# Patient Record
Sex: Female | Born: 1990 | Race: White | Hispanic: No | Marital: Single | Smoking: Never smoker
Health system: Southern US, Community
[De-identification: ages and names within clinical notes are randomized; demographics above are authoritative.]

---
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
## 2010-06-05 IMAGING — RF DG BARIUM SWALLOW
1 series · 15 of 24 positions shown · non-contrast
Comparison: none

REASON FOR EXAM: esophageal reflux chest pain
COMMENTS:

[Series 1: run · 6 acquisitions, 15 frames shown]
[im 1/6]
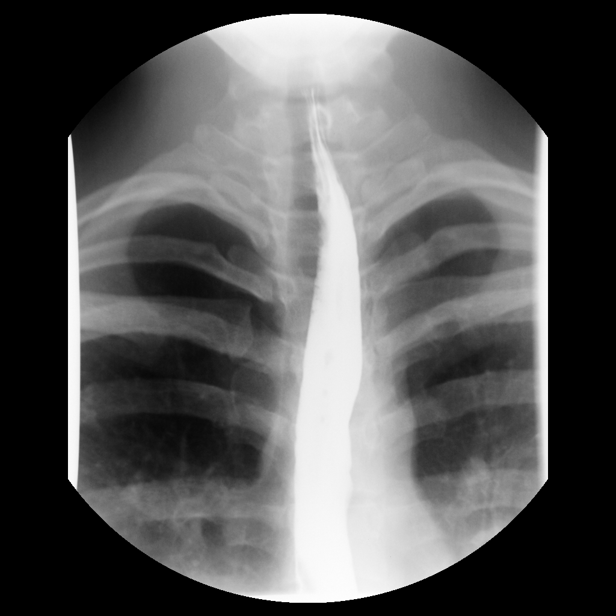
[im 1/6]
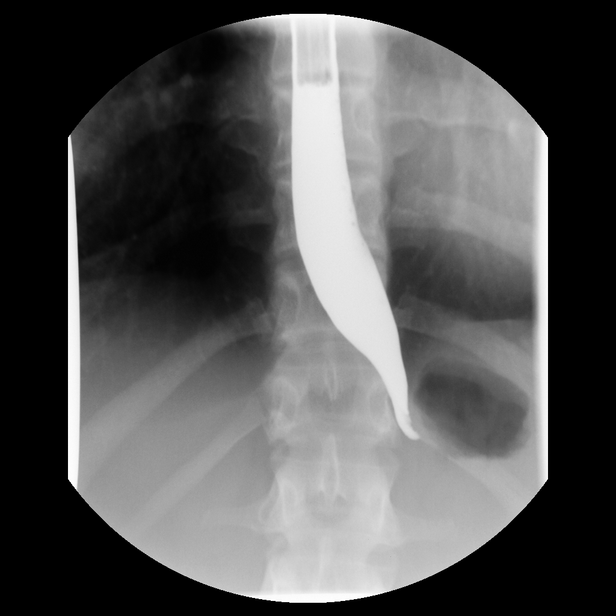
[im 2/6]
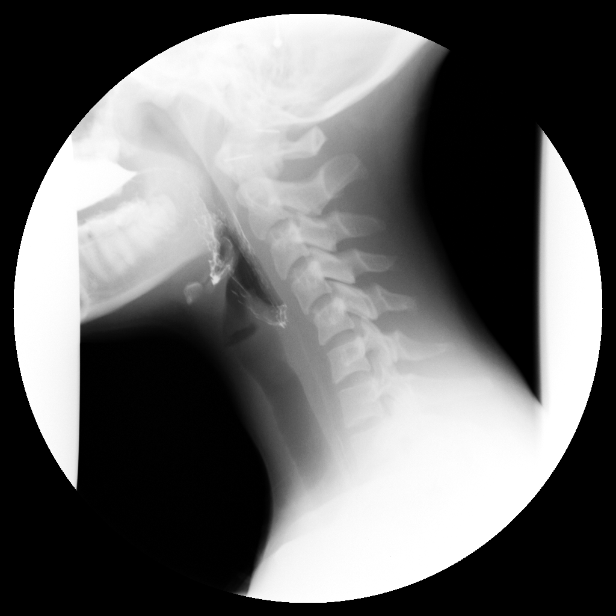
[im 2/6]
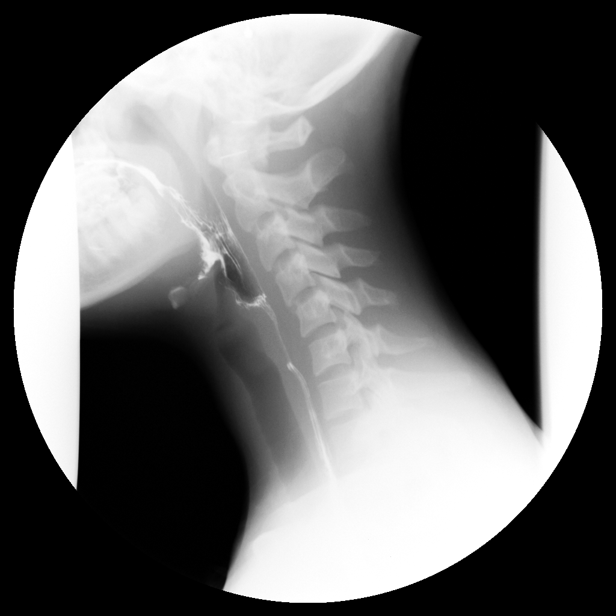
[im 2/6]
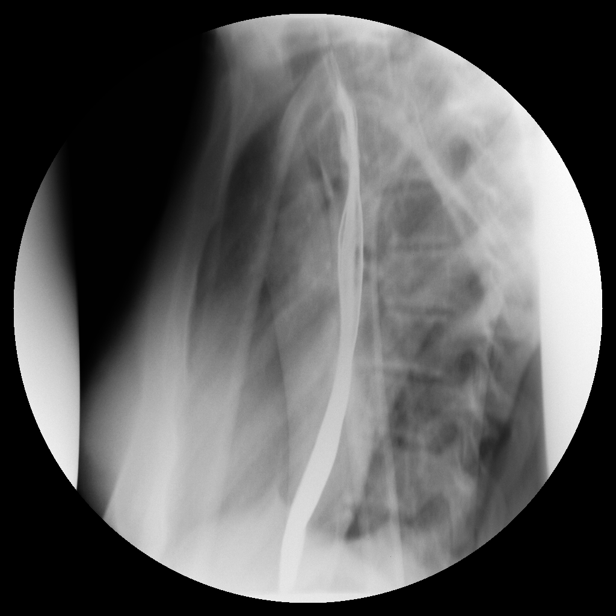
[im 3/6]
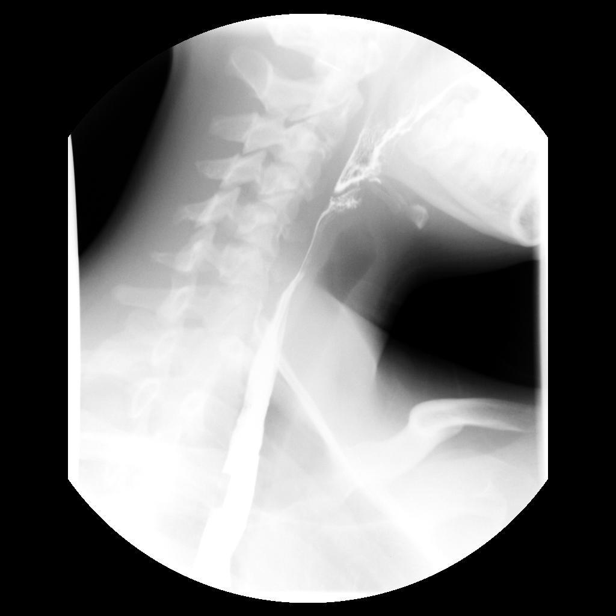
[im 3/6]
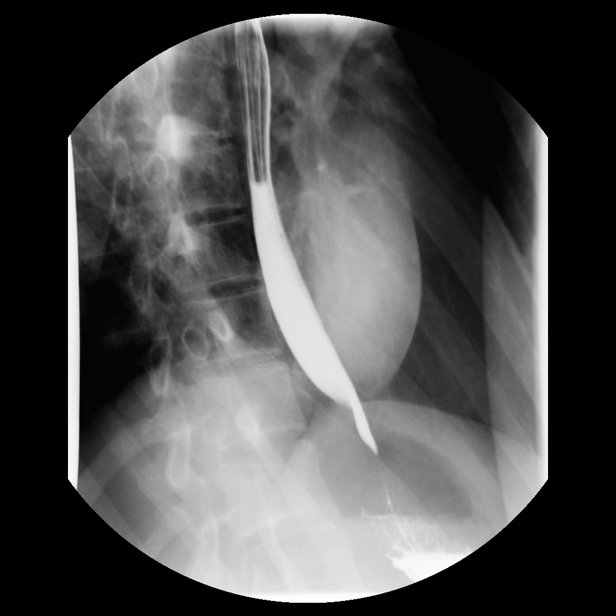
[im 4/6]
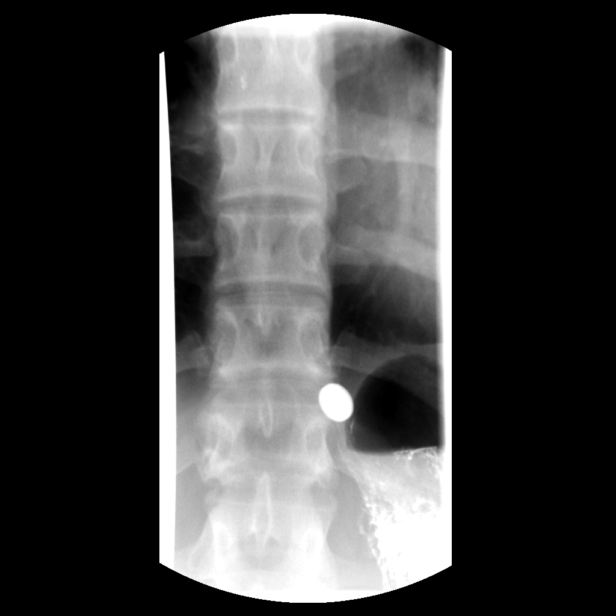
[im 4/6]
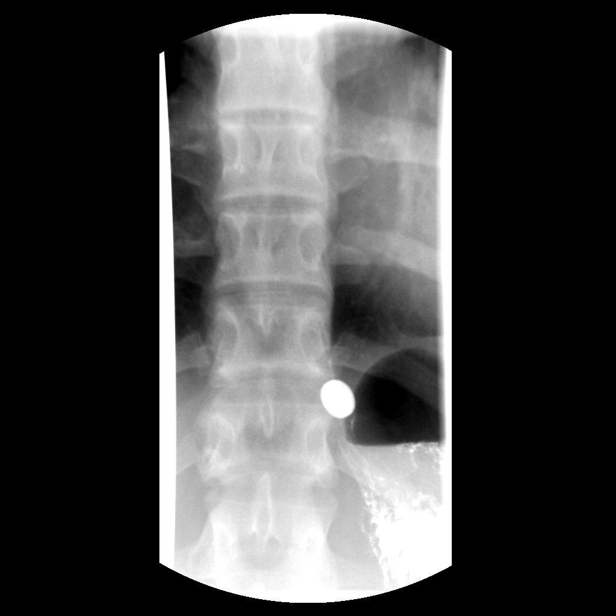
[im 4/6]
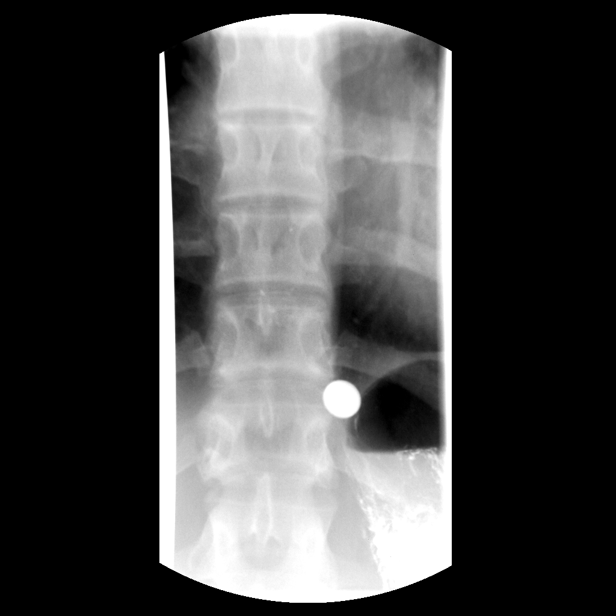
[im 5/6]
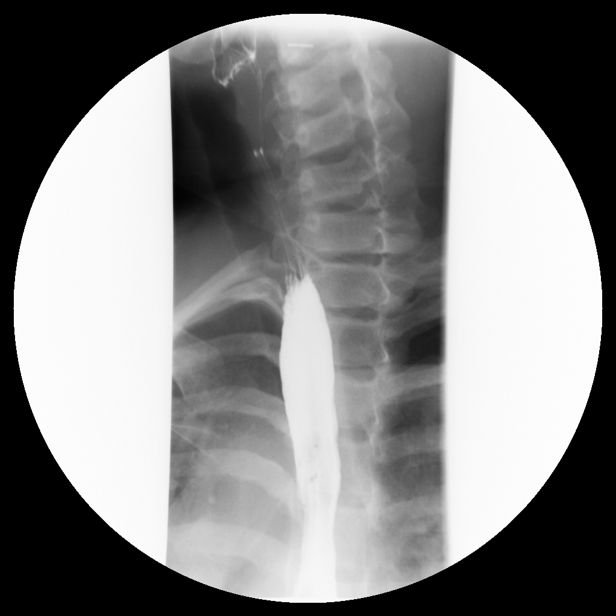
[im 5/6]
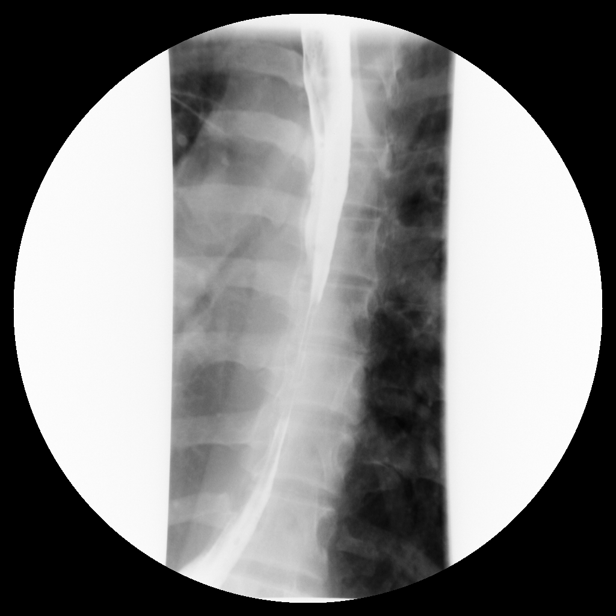
[im 6/6]
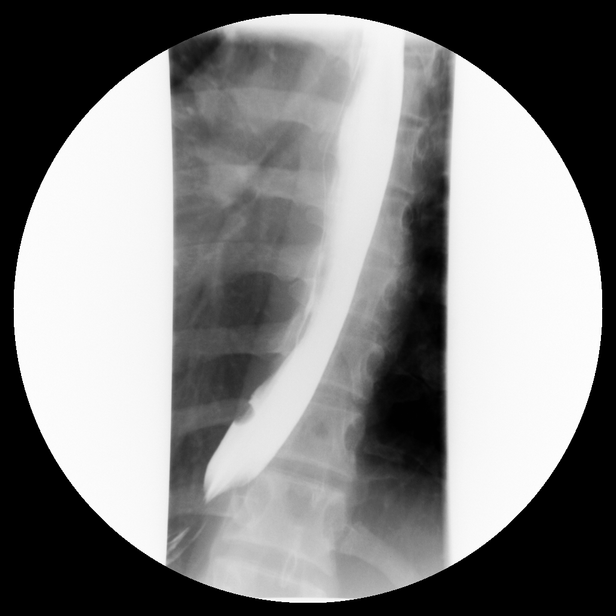
[im 6/6]
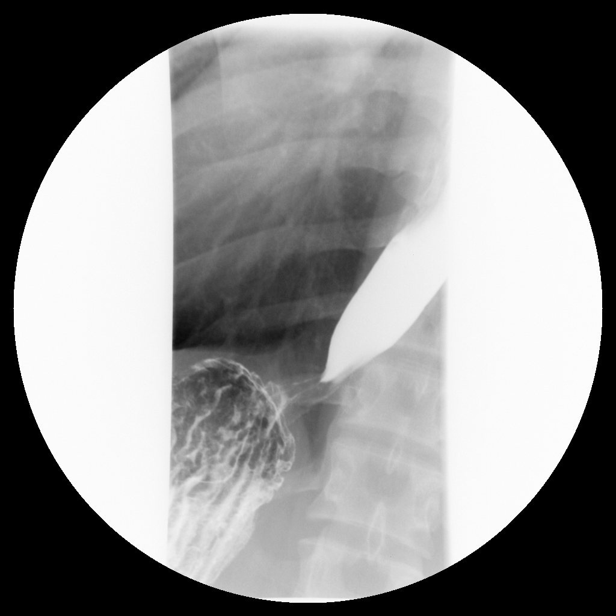
[im 6/6]
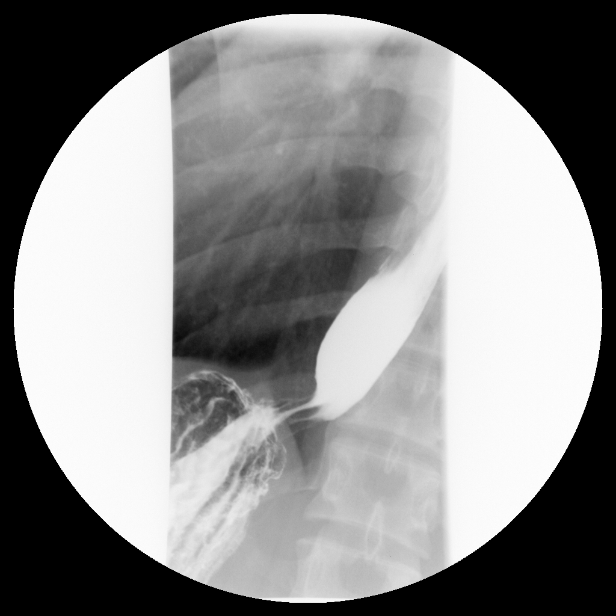

[15 of 24 positions shown; findings below may reference images not displayed]

PROCEDURE:     FL  - FL BARIUM SWALLOW  - July 13, 2009  [DATE]

RESULT:     A routine barium swallow examination shows the patient easily
ingests the liquid barium. There is no evidence of aspiration or penetration
of barium into the upper airway. The esophageal mucosal pattern is normal. A
12.5 mm barium-impregnated tablet passes easily through the esophagus into
the stomach. There is no gastroesophageal reflux or hiatal hernia.
IMPRESSION: Normal appearing barium swallow.

## 2013-08-20 HISTORY — PX: AUGMENTATION MAMMAPLASTY: SUR837

## 2013-10-13 ENCOUNTER — Ambulatory Visit: Payer: Self-pay | Admitting: Internal Medicine

## 2013-10-13 LAB — RAPID STREP-A WITH REFLX: MICRO TEXT REPORT: NEGATIVE

## 2013-10-16 LAB — BETA STREP CULTURE(ARMC)

## 2015-09-22 ENCOUNTER — Encounter: Payer: Self-pay | Admitting: Emergency Medicine

## 2015-09-22 ENCOUNTER — Ambulatory Visit
Admission: EM | Admit: 2015-09-22 | Discharge: 2015-09-22 | Disposition: A | Discharge status: Home / Self Care | Payer: BC Managed Care – PPO | Attending: Family Medicine | Admitting: Family Medicine

## 2015-09-22 DIAGNOSIS — N390 Urinary tract infection, site not specified: Secondary | ICD-10-CM

## 2015-09-22 LAB — URINALYSIS COMPLETE WITH MICROSCOPIC (ARMC ONLY)
Bilirubin Urine: NEGATIVE
Glucose, UA: NEGATIVE mg/dL
Ketones, ur: NEGATIVE mg/dL
NITRITE: NEGATIVE
PH: 5.5 (ref 5.0–8.0)
Protein, ur: >300 mg/dL — AB
Specific Gravity, Urine: >1.03 — ABNORMAL HIGH (ref 1.005–1.030)

## 2015-09-22 MED ORDER — CIPROFLOXACIN HCL 500 MG PO TABS: 500.0000 mg | ORAL_TABLET | Freq: Two times a day (BID) | ORAL | Status: DC

## 2015-09-22 MED ORDER — PHENAZOPYRIDINE HCL 200 MG PO TABS: 200.0000 mg | ORAL_TABLET | Freq: Three times a day (TID) | ORAL | Status: DC | PRN

## 2015-09-22 NOTE — ED Notes (Signed)
Pt reports dysuria that started yesterday denies known fever or vaginal discharged reports was seen at [facility] and had yearly exam 4 days ago

## 2015-09-22 NOTE — Discharge Instructions (Signed)

## 2015-09-22 NOTE — ED Provider Notes (Signed)
CSN: [id number]     Arrival date & time 09/22/15  4098 History   First MD Initiated Contact with Patient 09/22/15 [id number]    Nurses notes were reviewed. Chief Complaint  Patient presents with  . Dysuria   patient reports pain having sexual relations Wednesday and soon afterwards having dysuria and burning in urination. She doesn't report any frequency. Denies having vaginal discharge. States she did have some dyspareunia and states that she has something similar to this one time she had BV but this time there is no discharge. She doesn't usually get UTIs. (Consider location/radiation/quality/duration/timing/severity/associated sxs/prior Treatment) Patient is a 25 y.o. female presenting with dysuria. The history is provided by the patient. No language interpreter was used.  Dysuria Pain quality:  Sharp Pain severity:  Moderate Onset quality:  Sudden Duration:  1 day Progression:  Worsening Chronicity:  New Relieved by:  Nothing Worsened by:  Nothing tried Urinary symptoms: no discolored urine, no foul-smelling urine, no frequent urination, no hematuria and no bladder incontinence   Associated symptoms: no abdominal pain, no fever, no flank pain, no genital lesions and no vaginal discharge     No past medical history on file. No past surgical history on file. No family history on file. Social History  Substance Use Topics  . Smoking status: Never Smoker   . Smokeless tobacco: None  . Alcohol Use: Yes     Comment: occassional    OB History    No data available     Review of Systems  Constitutional: Negative for fever.  Gastrointestinal: Negative for abdominal pain.  Genitourinary: Positive for dysuria. Negative for flank pain and vaginal discharge.  All other systems reviewed and are negative.   Allergies  Review of patient's allergies indicates no known allergies.  Home Medications   Prior to Admission medications   Medication Sig Start Date End Date Taking? Authorizing  Provider  ciprofloxacin (CIPRO) 500 MG tablet Take 1 tablet (500 mg total) by mouth 2 (two) times daily. 09/22/15   Hassan Rowan, MD  phenazopyridine (PYRIDIUM) 200 MG tablet Take 1 tablet (200 mg total) by mouth 3 (three) times daily as needed for pain. 09/22/15   Hassan Rowan, MD   Meds Ordered and Administered this Visit  Medications - No data to display  BP 114/73 mmHg  Pulse 85  Temp(Src) 97 F (36.1 C) (Tympanic)  Resp 16  Ht  (1.753 m)  Wt 135 lb (61.236 kg)  BMI 19.93 kg/m2  SpO2 100%  LMP 09/15/2015 (Exact Date) No data found.   Physical Exam  Constitutional: She appears well-developed and well-nourished.  HENT:  Head: Normocephalic and atraumatic.  Eyes: Conjunctivae are normal. Pupils are equal, round, and reactive to light.  Abdominal: Soft. Bowel sounds are normal. She exhibits no distension. There is no tenderness. There is no CVA tenderness.  Musculoskeletal: Normal range of motion.  Neurological: She is alert.  Skin: Skin is dry. No erythema.  Psychiatric: Her mood appears anxious. She does not exhibit a depressed mood.  Vitals reviewed.   ED Course  Procedures (including critical care time)  Labs Review Labs Reviewed  URINALYSIS COMPLETEWITH MICROSCOPIC ([facility] ONLY) - Abnormal; Notable for the following:    Specific Gravity, Urine >1.030 (*)    Hgb urine dipstick TRACE (*)    Protein, ur >300 (*)    Leukocytes, UA 1+ (*)    Bacteria, UA MANY (*)    Squamous Epithelial / LPF 0-5 (*)    All other components  within normal limits  URINE CULTURE    Imaging Review No results found.   Visual Acuity Review  Right Eye Distance:   Left Eye Distance:   Bilateral Distance:    Right Eye Near:   Left Eye Near:    Bilateral Near:      Results for orders placed or performed during the hospital encounter of 09/22/15  Urine culture  Result Value Ref Range   Specimen Description URINE, CLEAN CATCH    Special Requests Normal    Culture >=100,000  COLONIES/mL STAPHYLOCOCCUS AUREUS    Report Status 09/24/2015 FINAL    Organism ID, Bacteria STAPHYLOCOCCUS AUREUS       Susceptibility   Staphylococcus aureus - MIC*    CIPROFLOXACIN <=0.5 SENSITIVE Sensitive     GENTAMICIN <=0.5 SENSITIVE Sensitive     NITROFURANTOIN <=16 SENSITIVE Sensitive     OXACILLIN <=0.25 SENSITIVE Sensitive     TETRACYCLINE <=1 SENSITIVE Sensitive     VANCOMYCIN 1 SENSITIVE Sensitive     TRIMETH/SULFA <=10 SENSITIVE Sensitive     RIFAMPIN <=0.5 SENSITIVE Sensitive     Inducible Clindamycin Value in next row Resistant      POSITIVEINDUCIBLE CLINDAMYCIN RESISTANCE - A positive ICR test is indicative of inducible resistance to macrolides, lincosamides, and type B streptogramin.  This isolate is presumed to be resistant to Clindamycin, however, Clindamycin may still be effective in some patients.     * >=100,000 COLONIES/mL STAPHYLOCOCCUS AUREUS  Urinalysis complete, with microscopic  Result Value Ref Range   Color, Urine YELLOW YELLOW   APPearance CLEAR CLEAR   Glucose, UA NEGATIVE NEGATIVE mg/dL   Bilirubin Urine NEGATIVE NEGATIVE   Ketones, ur NEGATIVE NEGATIVE mg/dL   Specific Gravity, Urine >1.030 (H) 1.005 - 1.030   Hgb urine dipstick TRACE (A) NEGATIVE   pH 5.5 5.0 - 8.0   Protein, ur >300 (A) NEGATIVE mg/dL   Nitrite NEGATIVE NEGATIVE   Leukocytes, UA 1+ (A) NEGATIVE   RBC / HPF 0-5 0 - 5 RBC/hpf   WBC, UA TOO NUMEROUS TO COUNT 0 - 5 WBC/hpf   Bacteria, UA MANY (A) NONE SEEN   Squamous Epithelial / LPF 0-5 (A) NONE SEEN     MDM   1. UTI (lower urinary tract infection)     Patient with an abnormal urine will obtain urine culture sensitivity placed on Cipro 500 mg 1 tablet twice a day for 7 days. If symptoms continue to come back for seen and be evaluated did discuss best hygiene practices of going to the bathroom before she has sexual relations. Worked OB office for today and tomorrow if needed. And also prescription for Pyridium 200 mg 3 times a  day for 5 days   Note: This dictation was prepared with Dragon dictation along with smaller phrase technology. Any transcriptional errors that result from this process are unintentional.   Hassan Rowan, MD 09/27/15 1032

## 2015-09-24 LAB — URINE CULTURE: SPECIAL REQUESTS: NORMAL

## 2015-11-15 DIAGNOSIS — A6000 Herpesviral infection of urogenital system, unspecified: Secondary | ICD-10-CM | POA: Insufficient documentation

## 2015-11-15 LAB — HM HIV SCREENING LAB: HM HIV Screening: NEGATIVE

## 2016-11-07 ENCOUNTER — Ambulatory Visit: Payer: Self-pay | Admitting: Advanced Practice Midwife

## 2016-11-28 ENCOUNTER — Other Ambulatory Visit: Payer: Self-pay

## 2016-11-28 DIAGNOSIS — Z30011 Encounter for initial prescription of contraceptive pills: Secondary | ICD-10-CM

## 2016-11-28 MED ORDER — VIENVA 0.1-20 MG-MCG PO TABS: 1.0000 | ORAL_TABLET | Freq: Every day | ORAL | 1 refills | Status: DC

## 2016-12-04 ENCOUNTER — Other Ambulatory Visit: Payer: Self-pay | Admitting: Advanced Practice Midwife

## 2016-12-04 DIAGNOSIS — Z30011 Encounter for initial prescription of contraceptive pills: Secondary | ICD-10-CM

## 2016-12-17 ENCOUNTER — Encounter: Payer: Self-pay | Admitting: Obstetrics & Gynecology

## 2016-12-17 ENCOUNTER — Ambulatory Visit (INDEPENDENT_AMBULATORY_CARE_PROVIDER_SITE_OTHER): Payer: BLUE CROSS/BLUE SHIELD | Admitting: Obstetrics & Gynecology

## 2016-12-17 VITALS — BP 126/72 | HR 97 | Ht 68.0 in | Wt 148.0 lb

## 2016-12-17 DIAGNOSIS — Z0000 Encounter for general adult medical examination without abnormal findings: Secondary | ICD-10-CM

## 2016-12-17 DIAGNOSIS — Z124 Encounter for screening for malignant neoplasm of cervix: Secondary | ICD-10-CM

## 2016-12-17 DIAGNOSIS — T384X5D Adverse effect of oral contraceptives, subsequent encounter: Secondary | ICD-10-CM

## 2016-12-17 MED ORDER — VIENVA 0.1-20 MG-MCG PO TABS: 1.0000 | ORAL_TABLET | Freq: Every day | ORAL | 12 refills | Status: DC

## 2016-12-17 NOTE — Progress Notes (Signed)
   HPI:      [name] is a 26 y.o. G0P0000 who LMP was Patient's last menstrual period was 12/05/2016., she presents today for her annual examination. The patient has no complaints today. The patient is sexually active. Her last pap: was normal. The patient does perform self breast exams.  There is no notable family history of breast or ovarian cancer in her family.  The patient has regular exercise: yes.  The patient denies current symptoms of depression.    GYN History: Contraception: OCP (estrogen/progesterone)  PMHx: History reviewed. No pertinent past medical history. Past Surgical History:  Procedure Laterality Date  . AUGMENTATION MAMMAPLASTY  2015   History reviewed. No pertinent family history. Social History  Substance Use Topics  . Smoking status: Never Smoker  . Smokeless tobacco: Never Used  . Alcohol use Yes     Comment: occassional     Current Outpatient Prescriptions:  .  ALPRAZolam (XANAX) 0.25 MG tablet, TAKE 1 TABLET BY MOUTH DAILY FOR SOCIAL ANXIETY/PHOBIA, Disp: , Rfl: 5 .  SYNTHROID 100 MCG tablet, Take 100 mcg by mouth daily., Disp: , Rfl: 5 .  VIENVA 0.1-20 MG-MCG tablet, Take 1 tablet by mouth daily., Disp: 1 Package, Rfl: 12 Allergies: Patient has no known allergies.  ROS  Objective: BP 126/72 (BP Location: Left Arm, Patient Position: Sitting, Cuff Size: Normal)   Pulse 97   Ht  (1.727 m)   Wt 148 lb (67.1 kg)   LMP 12/05/2016   BMI 22.50 kg/m   Filed Weights   12/17/16 1548  Weight: 148 lb (67.1 kg)   Body mass index is 22.5 kg/m. OBGyn Exam  Assessment:  ANNUAL EXAM 1. Annual physical exam   2. Screening for cervical cancer   3. Oral contraceptive causing adverse effect in therapeutic use, subsequent encounter      Screening Plan:            1.  Cervical Screening-  Pap smear done today  2. Breast screening- Exam annually and mammogram>40 planned   3. Colonoscopy every 10 years, Hemoccult testing - after age 21  4.  Labs Declines  5. Counseling for contraception: oral progesterone-only contraceptive  Other:  1. Annual physical exam  2. Screening for cervical cancer - IGP,CtNgTv,rfx Aptima HPV ASCU  3. Oral contraceptive causing adverse effect in therapeutic use, subsequent encounter - VIENVA 0.1-20 MG-MCG tablet; Take 1 tablet by mouth daily.  Dispense: 1 Package; Refill: 12  4. Past off and on HSV testing done at [facility].  No sx's of recent note.  Monitor.  Offered repeat testing, declined.    F/U  Return in about 1 year (around 12/17/2017).  [name], MD, [facility], [facility] 12/17/2016  4:30 PM

## 2016-12-20 LAB — IGP,CTNGTV,RFX APTIMA HPV ASCU
CHLAMYDIA, NUC. ACID AMP: NEGATIVE
Gonococcus, Nuc. Acid Amp: NEGATIVE
PAP SMEAR COMMENT: 0
Trich vag by NAA: NEGATIVE

## 2017-07-04 ENCOUNTER — Telehealth: Payer: Self-pay | Admitting: Obstetrics & Gynecology

## 2017-07-04 NOTE — Telephone Encounter (Signed)
Pt advised to call pharmacy for pick up
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
## 2017-07-04 NOTE — Telephone Encounter (Signed)
Mail box full pt has 12 refills on her BC just needs to call pharmacy and pick up

## 2018-01-29 ENCOUNTER — Other Ambulatory Visit: Payer: Self-pay | Admitting: Obstetrics & Gynecology

## 2018-01-29 DIAGNOSIS — T384X5D Adverse effect of oral contraceptives, subsequent encounter: Secondary | ICD-10-CM

## 2018-01-30 NOTE — Telephone Encounter (Signed)
Called and spoke with patient to schedule annual exam. Patient wishes to call back to schedule appt

## 2018-01-30 NOTE — Telephone Encounter (Signed)
Please schedule Annual Appt.  Will refill Rx for birth control while waiting for appt.

## 2018-02-27 ENCOUNTER — Other Ambulatory Visit (HOSPITAL_COMMUNITY)
Admission: RE | Admit: 2018-02-27 | Discharge: 2018-02-27 | Disposition: A | Discharge status: Home / Self Care | Payer: BLUE CROSS/BLUE SHIELD | Source: Ambulatory Visit | Attending: Obstetrics & Gynecology | Admitting: Obstetrics & Gynecology

## 2018-02-27 ENCOUNTER — Encounter: Payer: Self-pay | Admitting: Obstetrics & Gynecology

## 2018-02-27 ENCOUNTER — Ambulatory Visit (INDEPENDENT_AMBULATORY_CARE_PROVIDER_SITE_OTHER): Payer: BLUE CROSS/BLUE SHIELD | Admitting: Obstetrics & Gynecology

## 2018-02-27 VITALS — BP 100/60 | Ht 69.0 in | Wt 156.0 lb

## 2018-02-27 DIAGNOSIS — Z0000 Encounter for general adult medical examination without abnormal findings: Secondary | ICD-10-CM

## 2018-02-27 DIAGNOSIS — E039 Hypothyroidism, unspecified: Secondary | ICD-10-CM

## 2018-02-27 DIAGNOSIS — Z124 Encounter for screening for malignant neoplasm of cervix: Secondary | ICD-10-CM | POA: Diagnosis not present

## 2018-02-27 DIAGNOSIS — Z308 Encounter for other contraceptive management: Secondary | ICD-10-CM | POA: Insufficient documentation

## 2018-02-27 NOTE — Progress Notes (Signed)
HPI:      Sara Conway is a 27 y.o. G0P0000 who LMP was Patient's last menstrual period was 02/24/2018., she presents today for her annual examination. The patient has no complaints today. Occas Post Coital Bleeding.  Stopped OCPs 3 weeks ago, plans to use condoms only until after wedding then try for pregnancy.  Has noticed worsening acne since stopping.  The patient is sexually active. Her last pap: was normal. The patient does perform self breast exams.  There is no notable family history of breast or ovarian cancer in her family.  The patient has regular exercise: yes.  The patient denies current symptoms of depression.    GYN History: Contraception: condoms  PMHx: History reviewed. No pertinent past medical history. Past Surgical History:  Procedure Laterality Date  . AUGMENTATION MAMMAPLASTY  2015   History reviewed. No pertinent family history. Social History   Tobacco Use  . Smoking status: Never Smoker  . Smokeless tobacco: Never Used  Substance Use Topics  . Alcohol use: Yes    Comment: occassional   . Drug use: Yes    Types: Marijuana    Current Outpatient Medications:  .  ALPRAZolam (XANAX) 0.25 MG tablet, TAKE 1 TABLET BY MOUTH DAILY FOR SOCIAL ANXIETY/PHOBIA, Disp: , Rfl: 5 .  SYNTHROID 100 MCG tablet, Take 100 mcg by mouth daily., Disp: , Rfl: 5 .  LARISSIA 0.1-20 MG-MCG tablet, TAKE 1 TABLET BY MOUTH EVERY DAY (Patient not taking: Reported on 02/27/2018), Disp: 84 tablet, Rfl: 0 Allergies: Patient has no known allergies.  Review of Systems  Constitutional: Negative for chills, fever and malaise/fatigue.  HENT: Negative for congestion, sinus pain and sore throat.   Eyes: Negative for blurred vision and pain.  Respiratory: Negative for cough and wheezing.   Cardiovascular: Negative for chest pain and leg swelling.  Gastrointestinal: Negative for abdominal pain, constipation, diarrhea, heartburn, nausea and vomiting.  Genitourinary: Negative for dysuria,  frequency, hematuria and urgency.  Musculoskeletal: Negative for back pain, joint pain, myalgias and neck pain.  Skin: Negative for itching and rash.  Neurological: Negative for dizziness, tremors and weakness.  Endo/Heme/Allergies: Does not bruise/bleed easily.  Psychiatric/Behavioral: Negative for depression. The patient is not nervous/anxious and does not have insomnia.    Objective: BP 100/60   Ht 5\' 9"  (1.753 m)   Wt 156 lb (70.8 kg)   LMP 02/24/2018   BMI 23.04 kg/m   Filed Weights   02/27/18 0813  Weight: 156 lb (70.8 kg)   Body mass index is 23.04 kg/m. Physical Exam  Constitutional: She is oriented to person, place, and time. She appears well-developed and well-nourished. No distress.  Genitourinary: Rectum normal, vagina normal and uterus normal. Pelvic exam was performed with patient supine. There is no rash or lesion on the right labia. There is no rash or lesion on the left labia. Vagina exhibits no lesion. No bleeding in the vagina. Right adnexum does not display mass and does not display tenderness. Left adnexum does not display mass and does not display tenderness. Cervix does not exhibit motion tenderness, lesion, friability or polyp.   Uterus is mobile and midaxial. Uterus is not enlarged or exhibiting a mass.  HENT:  Head: Normocephalic and atraumatic. Head is without laceration.  Right Ear: Hearing normal.  Left Ear: Hearing normal.  Nose: No epistaxis.  No foreign bodies.  Mouth/Throat: Uvula is midline, oropharynx is clear and moist and mucous membranes are normal.  Eyes: Pupils are equal, round, and reactive  to light.  Neck: Normal range of motion. Neck supple. No thyromegaly present.  Cardiovascular: Normal rate and regular rhythm. Exam reveals no gallop and no friction rub.  No murmur heard. Pulmonary/Chest: Effort normal and breath sounds normal. No respiratory distress. She has no wheezes. Right breast exhibits no mass, no skin change and no tenderness.  Left breast exhibits no mass, no skin change and no tenderness.  Abdominal: Soft. Bowel sounds are normal. She exhibits no distension. There is no tenderness. There is no rebound.  Musculoskeletal: Normal range of motion.  Neurological: She is alert and oriented to person, place, and time. No cranial nerve deficit.  Skin: Skin is warm and dry.  Psychiatric: She has a normal mood and affect. Judgment normal.  Vitals reviewed.   Assessment:  ANNUAL EXAM 1. Annual physical exam   2. Encounter for other contraceptive management   3. Hypothyroidism, unspecified type   4. Screening for cervical cancer    Screening Plan:            1.  Cervical Screening-  Pap smear done today  2. Labs managed by PCP  TSH by endocrinologist last month normal  3. Counseling for contraception: condoms  Plans to try for pregnancy next year PNV discussed  4. Hypothyroidism, unspecified type Cont thyroid meds Pregnancy and thyroid discussed    F/U  Return in about 1 year (around 02/28/2019) for Annual.  Sara Major, MD, [facility], [facility] 02/27/2018  8:50 AM

## 2018-02-28 LAB — CYTOLOGY - PAP
Chlamydia: NEGATIVE
DIAGNOSIS: NEGATIVE
Neisseria Gonorrhea: NEGATIVE
TRICH (WINDOWPATH): NEGATIVE

## 2018-10-16 ENCOUNTER — Other Ambulatory Visit: Payer: Self-pay | Admitting: Obstetrics & Gynecology

## 2019-03-04 ENCOUNTER — Other Ambulatory Visit: Payer: Self-pay | Admitting: Family Medicine

## 2019-03-04 DIAGNOSIS — Z20822 Contact with and (suspected) exposure to covid-19: Secondary | ICD-10-CM

## 2019-03-05 ENCOUNTER — Other Ambulatory Visit: Payer: Self-pay | Admitting: Internal Medicine

## 2019-03-05 DIAGNOSIS — Z20822 Contact with and (suspected) exposure to covid-19: Secondary | ICD-10-CM

## 2019-03-10 ENCOUNTER — Telehealth: Payer: Self-pay

## 2019-03-10 ENCOUNTER — Other Ambulatory Visit: Payer: Self-pay | Admitting: Obstetrics & Gynecology

## 2019-03-10 ENCOUNTER — Ambulatory Visit: Payer: BC Managed Care – PPO | Admitting: Obstetrics & Gynecology

## 2019-03-10 LAB — NOVEL CORONAVIRUS, NAA: SARS-CoV-2, NAA: DETECTED — AB

## 2019-03-10 NOTE — Progress Notes (Signed)
Pt had Covid test as co-worker tested positive.  She went to drive thru facility; did not see MD.  Test returned positive.  I noted this today as prepared for her annual exam here.  Called and discussed results w her, she was not aware yet of the results.  Rescheduled her appt for >21 days from test per protocol.  She reports only sx's of diminished taste and smell.  Denies fever, cough, SOB.  Advised her to contact PCP for further instructions.  Advised her to quarantine at home and avoid exposure to others especially those at risk.  Advised her to seek medical care if worsening sx's develop.  Barnett Applebaum, MD, [facility], [facility] 03/10/2019  3:45 PM

## 2019-03-10 NOTE — Telephone Encounter (Signed)
Please call pt and discuss Corona results, pt has annual scheduled with [place] today

## 2019-03-30 ENCOUNTER — Other Ambulatory Visit (HOSPITAL_COMMUNITY)
Admission: RE | Admit: 2019-03-30 | Discharge: 2019-03-30 | Disposition: A | Discharge status: Home / Self Care | Payer: BC Managed Care – PPO | Source: Ambulatory Visit | Attending: Obstetrics & Gynecology | Admitting: Obstetrics & Gynecology

## 2019-03-30 ENCOUNTER — Other Ambulatory Visit: Payer: Self-pay

## 2019-03-30 ENCOUNTER — Ambulatory Visit (INDEPENDENT_AMBULATORY_CARE_PROVIDER_SITE_OTHER): Payer: BC Managed Care – PPO | Admitting: Obstetrics & Gynecology

## 2019-03-30 ENCOUNTER — Encounter: Payer: Self-pay | Admitting: Obstetrics & Gynecology

## 2019-03-30 VITALS — BP 120/80 | Ht 69.0 in | Wt 158.0 lb

## 2019-03-30 DIAGNOSIS — Z3169 Encounter for other general counseling and advice on procreation: Secondary | ICD-10-CM

## 2019-03-30 DIAGNOSIS — Z124 Encounter for screening for malignant neoplasm of cervix: Secondary | ICD-10-CM | POA: Diagnosis present

## 2019-03-30 DIAGNOSIS — N970 Female infertility associated with anovulation: Secondary | ICD-10-CM

## 2019-03-30 DIAGNOSIS — T384X5D Adverse effect of oral contraceptives, subsequent encounter: Secondary | ICD-10-CM

## 2019-03-30 DIAGNOSIS — Z308 Encounter for other contraceptive management: Secondary | ICD-10-CM

## 2019-03-30 DIAGNOSIS — Z01419 Encounter for gynecological examination (general) (routine) without abnormal findings: Secondary | ICD-10-CM

## 2019-03-30 MED ORDER — LARISSIA 0.1-20 MG-MCG PO TABS: 1.0000 | ORAL_TABLET | Freq: Every day | ORAL | 3 refills | Status: DC

## 2019-03-30 NOTE — Patient Instructions (Signed)
Female Infertility  Female infertility refers to a woman's inability to get pregnant (conceive) after a year of having sex regularly (or after 6 months in women over age 28) without using birth control. Infertility can also mean that a woman is not able to carry a pregnancy to full term. Both women and men can have fertility problems. What are the causes? This condition may be caused by:  Problems with reproductive organs. Infertility can result if a woman: ? Has an abnormally short cervix or a cervix that does not remain closed during a pregnancy. ? Has a blockage or scarring in the fallopian tubes. ? Has an abnormally shaped uterus. ? Has uterine fibroids. This is a benign mass of tissue or muscle (tumor) that can develop in the uterus. ? Is not ovulating in a regular way.  Certain medical conditions. These may include: ? Polycystic ovary syndrome (PCOS). This is a hormonal disorder that can cause small cysts to grow on the ovaries. This is the most common cause of infertility in women. ? Endometriosis. This is a condition in which the tissue that lines the uterus (endometrium) grows outside of its normal location. ? Cancer and cancer treatments, such as chemotherapy or radiation. ? Premature ovarian failure. This is when ovaries stop producing eggs and hormones before age 40. ? Sexually transmitted diseases, such as chlamydia or gonorrhea. ? Autoimmune disorders. These are disorders in which the body's defense system (immune system) attacks normal, healthy cells. Infertility can be linked to more than one cause. For some women, the cause of infertility is not known (unexplained infertility). What increases the risk?  Age. A woman's fertility declines with age, especially after her mid-30s.  Being underweight or overweight.  Drinking too much alcohol.  Using drugs such as anabolic steroids, cocaine, and marijuana.  Exercising excessively.  Being exposed to environmental toxins,  such as radiation, pesticides, and certain chemicals. What are the signs or symptoms? The main sign of infertility in women is the inability to get pregnant or carry a pregnancy to full term. How is this diagnosed? This condition may be diagnosed by:  Checking whether you are ovulating each month. The tests may include: ? Blood tests to check hormone levels. ? An ultrasound of the ovaries. ? Taking a small tissue that lines the uterus and checking it under a microscope (endometrial biopsy).  Doing additional tests. This is done if ovulation is normal. Tests may include: ? Hysterosalpingography. This X-ray test can show the shape of the uterus and whether the fallopian tubes are open. ? Laparoscopy. This test uses a lighted tube (laparoscope) to look for problems in the fallopian tubes and other organs. ? Transvaginal ultrasound. This imaging test is used to check for abnormalities in the uterus and ovaries. ? Hysteroscopy. This test uses a lighted tube to check for problems in the cervix and the uterus. To be diagnosed with infertility, both partners will have a physical exam. Both partners will also have an extensive medical and sexual history taken. Additional tests may be done. How is this treated? Treatment depends on the cause of infertility. Most cases of infertility in women are treated with medicine or surgery.  Women may take medicine to: ? Correct ovulation problems. ? Treat other health conditions.  Surgery may be done to: ? Repair damage to the ovaries, fallopian tubes, cervix, or uterus. ? Remove growths from the uterus. ? Remove scar tissue from the uterus, pelvis, or other organs. Assisted reproductive technology (ART) Assisted reproductive technology (  ART) refers to all treatments and procedures that combine eggs and sperm outside the body to try to help a couple conceive. ART is often combined with fertility drugs to stimulate ovulation. Sometimes ART is done using eggs  retrieved from another woman's body (donor eggs) or from previously frozen fertilized eggs (embryos). There are different types of ART. These include:  Intrauterine insemination (IUI). A long, thin tube is used to place sperm directly into a woman's uterus. This procedure: ? Is effective for infertility caused by sperm problems, including low sperm count and low motility. ? Can be used in combination with fertility drugs.  In vitro fertilization (IVF). This is done when a woman's fallopian tubes are blocked or when a man has low sperm count. In this procedure: ? Fertility drugs are used to stimulate the ovaries to produce multiple eggs. ? Once mature, these eggs are removed from the body and combined with the sperm to be fertilized. ? The fertilized eggs are then placed into the woman's uterus. Follow these instructions at home:  Take over-the-counter and prescription medicines only as told by your health care provider.  Do not use any products that contain nicotine or tobacco, such as cigarettes and e-cigarettes. If you need help quitting, ask your health care provider.  If you drink alcohol, limit how much you have to 1 drink a day.  Make dietary changes to lose weight or maintain a healthy weight. Work with your health care provider and a dietitian to set a weight-loss goal that is healthy and reasonable for you.  Seek support from a counselor or support group to talk about your concerns related to infertility. Couples counseling may be helpful for you and your partner.  Practice stress reduction techniques that work well for you, such as regular physical activity, meditation, or deep breathing.  Keep all follow-up visits as told by your health care provider. This is important. Contact a health care provider if you:  Feel that stress is interfering with your life and relationships.  Have side effects from treatments for infertility. Summary  Female infertility refers to a woman's  inability to get pregnant (conceive) after a year of having sex regularly (or after 6 months in women over age 28) without using birth control.  To be diagnosed with infertility, both partners will have a physical exam. Both partners will also have an extensive medical and sexual history taken.  Seek support from a counselor or support group to talk about your concerns related to infertility. Couples counseling may be helpful for you and your partner. This information is not intended to replace advice given to you by your health care provider. Make sure you discuss any questions you have with your health care provider. Document Released: 08/09/2003 Document Revised: 11/27/2018 Document Reviewed: 07/08/2017 Elsevier Patient Education  2020 Elsevier Inc.  

## 2019-03-30 NOTE — Progress Notes (Signed)
HPI:      [name] is a 28 y.o. G0P0000 who LMP was Patient's last menstrual period was 02/27/2019., she presents today for her annual examination. The patient has no complaints today. The patient is sexually active. Her last pap: approximate date 2019 and was normal. The patient does perform self breast exams.  There is no notable family history of breast or ovarian cancer in her family.  The patient has regular exercise: yes.  The patient denies current symptoms of depression.    GYN History: Contraception: none for one year, reg cycles, no pregnancy    Sara Conway has child previously, but has reported use of steroids for body building since that time    OPK pos at home    No prior infection, trauma, STD, surgery.  PMHx: History reviewed. No pertinent past medical history. Past Surgical History:  Procedure Laterality Date  . AUGMENTATION MAMMAPLASTY  2015   History reviewed. No pertinent family history. Social History   Tobacco Use  . Smoking status: Never Smoker  . Smokeless tobacco: Never Used  Substance Use Topics  . Alcohol use: Yes    Comment: occassional   . Drug use: Yes    Types: Marijuana    Current Outpatient Medications:  .  ALPRAZolam (XANAX) 0.25 MG tablet, TAKE 1 TABLET BY MOUTH DAILY FOR SOCIAL ANXIETY/PHOBIA, Disp: , Rfl: 5 .  ARMOUR THYROID 90 MG tablet, TAKE 1 TABLET BY MOUTH EVERY DAY IN THE MORNING, Disp: , Rfl:  .  LARISSIA 0.1-20 MG-MCG tablet, TAKE 1 TABLET BY MOUTH EVERY DAY, Disp: 84 tablet, Rfl: 0 .  SYNTHROID 100 MCG tablet, Take 100 mcg by mouth daily., Disp: , Rfl: 5 Allergies: Patient has no known allergies.  Review of Systems  Constitutional: Negative for chills, fever and malaise/fatigue.  HENT: Negative for congestion, sinus pain and sore throat.   Eyes: Negative for blurred vision and pain.  Respiratory: Negative for cough and wheezing.   Cardiovascular: Negative for chest pain and leg swelling.  Gastrointestinal: Negative for  abdominal pain, constipation, diarrhea, heartburn, nausea and vomiting.  Genitourinary: Negative for dysuria, frequency, hematuria and urgency.  Musculoskeletal: Negative for back pain, joint pain, myalgias and neck pain.  Skin: Negative for itching and rash.  Neurological: Negative for dizziness, tremors and weakness.  Endo/Heme/Allergies: Does not bruise/bleed easily.  Psychiatric/Behavioral: Negative for depression. The patient is not nervous/anxious and does not have insomnia.     Objective: BP 120/80   Ht 5\' 9"  (1.753 m)   Wt 158 lb (71.7 kg)   LMP 02/27/2019   BMI 23.33 kg/m   Filed Weights   03/30/19 1523  Weight: 158 lb (71.7 kg)   Body mass index is 23.33 kg/m. Physical Exam Constitutional:      General: She is not in acute distress.    Appearance: She is well-developed.  Genitourinary:     Pelvic exam was performed with patient supine.     Vagina, uterus and rectum normal.     No lesions in the vagina.     No vaginal bleeding.     No cervical motion tenderness, friability, lesion or polyp.     Uterus is mobile.     Uterus is not enlarged.     No uterine mass detected.    Uterus is midaxial.     No right or left adnexal mass present.     Right adnexa not tender.     Left adnexa not tender.  HENT:  Head: Normocephalic and atraumatic. No laceration.     Right Ear: Hearing normal.     Left Ear: Hearing normal.     Mouth/Throat:     Pharynx: Uvula midline.  Eyes:     Pupils: Pupils are equal, round, and reactive to light.  Neck:     Musculoskeletal: Normal range of motion and neck supple.     Thyroid: No thyromegaly.  Cardiovascular:     Rate and Rhythm: Normal rate and regular rhythm.     Heart sounds: No murmur. No friction rub. No gallop.   Pulmonary:     Effort: Pulmonary effort is normal. No respiratory distress.     Breath sounds: Normal breath sounds. No wheezing.  Chest:     Breasts:        Right: No mass, skin change or tenderness.         Left: No mass, skin change or tenderness.  Abdominal:     General: Bowel sounds are normal. There is no distension.     Palpations: Abdomen is soft.     Tenderness: There is no abdominal tenderness. There is no rebound.  Musculoskeletal: Normal range of motion.  Neurological:     Mental Status: She is alert and oriented to person, place, and time.     Cranial Nerves: No cranial nerve deficit.  Skin:    General: Skin is warm and dry.  Psychiatric:        Judgment: Judgment normal.  Vitals signs reviewed.    Assessment:  ANNUAL EXAM 1. Women's annual routine gynecological examination   2. Encounter for other contraceptive management   3. Screening for cervical cancer   4. Encounter for preconception consultation   5. Ovulation failure    Screening Plan:            1.  Cervical Screening-  Pap smear done today  2.  Labs managed by PCP  3. Counseling for contraception: no method   4. Encounter for preconception consultation - Rubella screen - Varicella zoster antibody, IgG   5. Ovulation failure - FSH/LH - Testosterone, Free, Total, SHBG - Estradiol      F/U  Return in about 1 year (around 03/29/2020) for Annual.  Annamarie MajorPaul Holden Maniscalco, MD, Merlinda FrederickFACOG [facility] 03/30/2019  3:58 PM

## 2019-04-01 LAB — CYTOLOGY - PAP: Diagnosis: NEGATIVE

## 2019-04-01 LAB — TESTOSTERONE, FREE, TOTAL, SHBG
Sex Hormone Binding: 57.5 nmol/L (ref 24.6–122.0)
Testosterone, Free: 1.3 pg/mL (ref 0.0–4.2)
Testosterone: 18 ng/dL (ref 8–48)

## 2019-04-01 LAB — RUBELLA SCREEN: Rubella Antibodies, IGG: 1.32 index (ref >0.99)

## 2019-04-01 LAB — VARICELLA ZOSTER ANTIBODY, IGG: Varicella zoster IgG: 1215 index (ref >165)

## 2019-04-01 LAB — FSH/LH
FSH: 4.3 m[IU]/mL
LH: 8.5 m[IU]/mL

## 2019-04-01 LAB — ESTRADIOL: Estradiol: 106 pg/mL

## 2019-05-26 ENCOUNTER — Institutional Professional Consult (permissible substitution): Payer: BC Managed Care – PPO | Admitting: Plastic Surgery

## 2019-08-07 DIAGNOSIS — F419 Anxiety disorder, unspecified: Secondary | ICD-10-CM

## 2019-08-10 ENCOUNTER — Other Ambulatory Visit: Payer: Self-pay

## 2019-08-10 ENCOUNTER — Ambulatory Visit: Payer: Self-pay | Admitting: Physician Assistant

## 2019-08-10 DIAGNOSIS — Z5321 Procedure and treatment not carried out due to patient leaving prior to being seen by health care provider: Secondary | ICD-10-CM

## 2019-08-10 NOTE — Progress Notes (Signed)
Patient left without being seen and stated to RN that she would reschedule.

## 2019-10-24 ENCOUNTER — Other Ambulatory Visit: Payer: Self-pay | Admitting: Physician Assistant

## 2019-10-24 NOTE — Telephone Encounter (Signed)
Client will need in-person visit prior to more refills.  OK refill of Valtrex 500mg  #90 1 po daily with refills x 4.

## 2019-12-21 ENCOUNTER — Ambulatory Visit: Payer: Self-pay | Admitting: Family Medicine

## 2019-12-21 ENCOUNTER — Other Ambulatory Visit: Payer: Self-pay

## 2019-12-21 ENCOUNTER — Encounter: Payer: Self-pay | Admitting: Family Medicine

## 2019-12-21 DIAGNOSIS — Z113 Encounter for screening for infections with a predominantly sexual mode of transmission: Secondary | ICD-10-CM

## 2019-12-21 DIAGNOSIS — A6004 Herpesviral vulvovaginitis: Secondary | ICD-10-CM

## 2019-12-21 LAB — WET PREP FOR TRICH, YEAST, CLUE
Trichomonas Exam: NEGATIVE
Yeast Exam: NEGATIVE

## 2019-12-21 NOTE — Progress Notes (Signed)
[facility] Department STI clinic/screening visit  Subjective:  Sara Conway is a 29 y.o. female being seen today for  Chief Complaint  Patient presents with  . SEXUALLY TRANSMITTED DISEASE    STD screening including bloodwork     The patient reports they do have symptoms. Patient reports that they do not desire a pregnancy in the next year. They reported they are not interested in discussing contraception today.   Patient has the following medical conditions:   Patient Active Problem List   Diagnosis Date Noted  . Hypothyroidism 02/27/2018  . Encounter for other contraceptive management 02/27/2018  . 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 Anxiety 10/09/2011    HPI  Pt reports she would like STI testing. She has had vaginal discomfort x2 wks. Also, a few wks ago had tonsillitis, was prescribed augmentin which helped, but she continues to have swollen lymph nodes.   See flowsheet for further details and programmatic requirements.    No LMP recorded. Last sex: yesterday BCM: OCP Desires EC? n/a   No components found for: HCV  The following portions of the patient's history were reviewed and updated as appropriate: allergies, current medications, past medical history, past social history, past surgical history and problem list.  Objective:  There were no vitals filed for this visit.   Physical Exam Vitals and nursing note reviewed.  Constitutional:      Appearance: Normal appearance.  HENT:     Head: Normocephalic and atraumatic.     Mouth/Throat:     Mouth: Mucous membranes are moist.     Pharynx: Oropharynx is clear. No oropharyngeal exudate or posterior oropharyngeal erythema.  Pulmonary:     Effort: Pulmonary effort is normal.  Abdominal:     General: Abdomen is flat.     Palpations: There is no mass.     Tenderness: There is no abdominal tenderness. There is no rebound.  Genitourinary:    General: Normal vulva.     Exam position: Lithotomy position.   Pubic Area: No rash or pubic lice.      Labia:        Right: Lesion present. No rash.        Left: No rash or lesion.      Vagina: Vaginal discharge (white, creamy, ph<4.5) present. No erythema, bleeding or lesions.     Cervix: No cervical motion tenderness, discharge, friability, lesion or erythema.     Uterus: Normal.      Adnexa: Right adnexa normal and left adnexa normal.     Rectum: Normal.    Lymphadenopathy:     Head:     Right side of head: No preauricular or posterior auricular adenopathy.     Left side of head: No preauricular or posterior auricular adenopathy.     Cervical: No cervical adenopathy.     Upper Body:     Right upper body: No supraclavicular or axillary adenopathy.     Left upper body: No supraclavicular or axillary adenopathy.     Lower Body: No right inguinal adenopathy. No left inguinal adenopathy.  Skin:    General: Skin is warm and dry.     Findings: No rash.  Neurological:     Mental Status: She is alert and oriented to person, place, and time.      Assessment and Plan:  Sara Conway is a 29 y.o. female presenting to the [facility] Department for STI screening    1. Screening examination for venereal disease -Pt  with symptoms. Screenings today as below. Treat wet prep per standing order. -Patient does meet criteria for HepB, HepC Screening. Accepts these screenings. -Counseled on warning s/sx and when to seek care. Recommended condom use with all sex and discussed importance of condom use for STI prevention. - WET PREP FOR [place], YEAST, CLUE - Chlamydia/Gonorrhea [place] [facility] - vaginal - HBV Antigen/Antibody [facility] - HIV/HCV [place] [facility] - Syphilis Serology, [place] [facility] - Chlamydia/Gonorrhea [place] - throat  2. Herpes simplex vulvovaginitis -Current outbreak appears resolving but as she continues to have symptoms advised to take valacyclovir 500mg  once daily x3 days from current prescription.     Return if symptoms  worsen or fail to improve.  Future Appointments  Date Time Provider [place]  03/30/2020  9:00 AM Gae Dry, MD [facility] None    Kandee Keen, PA-C

## 2019-12-21 NOTE — Progress Notes (Signed)
Wet mount reviewed, no tx per standing order. Provider orders completed. 

## 2019-12-24 LAB — HEPATITIS B SURFACE ANTIGEN

## 2019-12-28 LAB — HM HIV SCREENING LAB: HM HIV Screening: NEGATIVE

## 2019-12-28 LAB — HM HEPATITIS C SCREENING LAB: HM Hepatitis Screen: NEGATIVE

## 2020-02-01 ENCOUNTER — Other Ambulatory Visit: Payer: Self-pay

## 2020-02-01 DIAGNOSIS — T384X5D Adverse effect of oral contraceptives, subsequent encounter: Secondary | ICD-10-CM

## 2020-02-01 MED ORDER — LARISSIA 0.1-20 MG-MCG PO TABS: 1.0000 | ORAL_TABLET | Freq: Every day | ORAL | 3 refills | Status: DC

## 2020-03-30 ENCOUNTER — Ambulatory Visit: Payer: Self-pay | Admitting: Obstetrics & Gynecology

## 2020-05-04 ENCOUNTER — Encounter: Payer: Self-pay | Admitting: Obstetrics & Gynecology

## 2020-05-04 ENCOUNTER — Other Ambulatory Visit (HOSPITAL_COMMUNITY)
Admission: RE | Admit: 2020-05-04 | Discharge: 2020-05-04 | Disposition: A | Discharge status: Home / Self Care | Payer: BC Managed Care – PPO | Source: Ambulatory Visit | Attending: Obstetrics & Gynecology | Admitting: Obstetrics & Gynecology

## 2020-05-04 ENCOUNTER — Other Ambulatory Visit: Payer: Self-pay

## 2020-05-04 ENCOUNTER — Ambulatory Visit (INDEPENDENT_AMBULATORY_CARE_PROVIDER_SITE_OTHER): Payer: No Typology Code available for payment source | Admitting: Obstetrics & Gynecology

## 2020-05-04 VITALS — BP 110/70 | Ht 69.0 in | Wt 157.0 lb

## 2020-05-04 DIAGNOSIS — Z124 Encounter for screening for malignant neoplasm of cervix: Secondary | ICD-10-CM

## 2020-05-04 DIAGNOSIS — Z01419 Encounter for gynecological examination (general) (routine) without abnormal findings: Secondary | ICD-10-CM

## 2020-05-04 DIAGNOSIS — T384X5D Adverse effect of oral contraceptives, subsequent encounter: Secondary | ICD-10-CM | POA: Diagnosis not present

## 2020-05-04 MED ORDER — LARISSIA 0.1-20 MG-MCG PO TABS: 1.0000 | ORAL_TABLET | Freq: Every day | ORAL | 3 refills | Status: AC

## 2020-05-04 NOTE — Patient Instructions (Addendum)
PAP every year Labs yearly (with PCP)  Thank you for choosing [facility]. As part of our ongoing efforts to improve patient experience, we would appreciate your feedback. Please fill out the short survey that you will receive by mail or MyChart. Your opinion is important to [place]! - Dr. Tiburcio Pea   Hormonal Contraception Information Hormonal contraception is a type of birth control that uses hormones to prevent pregnancy. It usually involves a combination of the hormones estrogen and progesterone or only the hormone progesterone. Hormonal contraception works in these ways:  It thickens the mucus in the cervix, making it harder for sperm to enter the uterus.  It changes the lining of the uterus, making it harder for an egg to implant.  It may stop the ovaries from releasing eggs (ovulation). Some women who take hormonal contraceptives that contain only progesterone may continue to ovulate. Hormonal contraception cannot prevent sexually transmitted infections (STIs). Pregnancy may still occur. Estrogen and progesterone contraceptives Contraceptives that use a combination of estrogen and progesterone are available in these forms:  Pill. Pills come in different combinations of hormones. They must be taken at the same time each day. Pills can affect your period, causing you to get your period once every three months or not at all.  Patch. The patch must be worn on the lower abdomen for three weeks and then removed on the fourth.  Vaginal ring. The ring is placed in the vagina and left there for three weeks. It is then removed for one week. Progesterone contraceptives Contraceptives that use progesterone only are available in these forms:  Pill. Pills should be taken every day of the cycle.  Intrauterine device (IUD). This device is inserted into the uterus and removed or replaced every five years or sooner.  Implant. Plastic rods are placed under the skin of the upper arm. They are removed or  replaced every three years or sooner.  Injection. The injection is given once every 90 days. What are the side effects? The side effects of estrogen and progesterone contraceptives include:  Nausea.  Headaches.  Breast tenderness.  Bleeding or spotting between menstrual cycles.  High blood pressure (rare).  Strokes, heart attacks, or blood clots (rare) Side effects of progesterone-only contraceptives include:  Nausea.  Headaches.  Breast tenderness.  Unpredictable menstrual bleeding.  High blood pressure (rare). Talk to your health care provider about what side effects may affect you. Where to find more information  Ask your health care provider for more information and resources about hormonal contraception.  [place] Department of Health and Cytogeneticist on Women's Health: http://hoffman.com/ Questions to ask:  What type of hormonal contraception is right for me?  How long should I plan to use hormonal contraception?  What are the side effects of the hormonal contraception method I choose?  How can I prevent STIs while using hormonal contraception? Contact a health care provider if:  You start taking hormonal contraceptives and you develop persistent or severe side effects. Summary  Estrogen and progesterone are hormones used in many forms of birth control.  Talk to your health care provider about what side effects may affect you.  Hormonal contraception cannot prevent sexually transmitted infections (STIs).  Ask your health care provider for more information and resources about hormonal contraception. This information is not intended to replace advice given to you by your health care provider. Make sure you discuss any questions you have with your health care provider. Document Revised: 12/01/2018 Document Reviewed: 07/06/2016 Elsevier Patient  Education  2020 Elsevier Inc.  

## 2020-05-04 NOTE — Progress Notes (Signed)
HPI:      [name] is a 29 y.o. G0P0000 who LMP was Patient's last menstrual period was 04/29/2020., she presents today for her annual examination. The patient has no complaints today. The patient is sexually active.  New partner, not trying for pregnancy currently.   Her last pap: approximate date 2020 and was normal. The patient does perform self breast exams.  There is no notable family history of breast or ovarian cancer in her family.  The patient has regular exercise: yes.  The patient denies current symptoms of depression.    GYN History: Contraception: OCP (estrogen/progesterone)  PMHx: History reviewed. No pertinent past medical history. Past Surgical History:  Procedure Laterality Date  . AUGMENTATION MAMMAPLASTY  2015   Family History  Problem Relation Age of Onset  . Breast cancer Paternal Grandmother   . Breast cancer Maternal Great-grandmother   . Breast cancer Paternal Great-grandmother    Social History   Tobacco Use  . Smoking status: Never Smoker  . Smokeless tobacco: Never Used  Vaping Use  . Vaping Use: Never used  Substance Use Topics  . Alcohol use: Yes    Alcohol/week: 1.0 standard drink    Types: 1 Cans of beer per week  . Drug use: Yes    Frequency: 1.0 times per week    Types: Marijuana    Current Outpatient Medications:  .  ALPRAZolam (XANAX) 0.5 MG tablet, Take 0.5 mg by mouth 2 (two) times daily., Disp: , Rfl:  .  ARMOUR THYROID 90 MG tablet, TAKE 1 TABLET BY MOUTH EVERY DAY IN THE MORNING, Disp: , Rfl:  .  FLUoxetine (PROZAC) 10 MG tablet, Take 10 mg by mouth daily., Disp: , Rfl:  .  LARISSIA 0.1-20 MG-MCG tablet, Take 1 tablet by mouth daily., Disp: 84 tablet, Rfl: 3 .  SYNTHROID 100 MCG tablet, Take 100 mcg by mouth daily., Disp: , Rfl: 5 .  valACYclovir (VALTREX) 500 MG tablet, TAKE 1 TABLET BY MOUTH EVERY DAY, Disp: 90 tablet, Rfl: 4 Allergies: Patient has no known allergies.  Review of Systems  Constitutional: Negative for  chills, fever and malaise/fatigue.  HENT: Negative for congestion, sinus pain and sore throat.   Eyes: Negative for blurred vision and pain.  Respiratory: Negative for cough and wheezing.   Cardiovascular: Negative for chest pain and leg swelling.  Gastrointestinal: Negative for abdominal pain, constipation, diarrhea, heartburn, nausea and vomiting.  Genitourinary: Negative for dysuria, frequency, hematuria and urgency.  Musculoskeletal: Negative for back pain, joint pain, myalgias and neck pain.  Skin: Negative for itching and rash.  Neurological: Negative for dizziness, tremors and weakness.  Endo/Heme/Allergies: Does not bruise/bleed easily.  Psychiatric/Behavioral: Negative for depression. The patient is not nervous/anxious and does not have insomnia.     Objective: BP 110/70   Ht 5\' 9"  (1.753 m)   Wt 157 lb (71.2 kg)   LMP 04/29/2020   BMI 23.18 kg/m   Filed Weights   05/04/20 1320  Weight: 157 lb (71.2 kg)   Body mass index is 23.18 kg/m. Physical Exam Constitutional:      General: She is not in acute distress.    Appearance: She is well-developed.  Genitourinary:     Pelvic exam was performed with patient supine.     Vagina, uterus and rectum normal.     No lesions in the vagina.     No vaginal bleeding.     No cervical motion tenderness, friability, lesion or polyp.  Uterus is mobile.     Uterus is not enlarged.     No uterine mass detected.    Uterus is midaxial.     No right or left adnexal mass present.     Right adnexa not tender.     Left adnexa not tender.  HENT:     Head: Normocephalic and atraumatic. No laceration.     Right Ear: Hearing normal.     Left Ear: Hearing normal.     Mouth/Throat:     Pharynx: Uvula midline.  Eyes:     Pupils: Pupils are equal, round, and reactive to light.  Neck:     Thyroid: No thyromegaly.  Cardiovascular:     Rate and Rhythm: Normal rate and regular rhythm.     Heart sounds: No murmur heard.  No friction rub.  No gallop.   Pulmonary:     Effort: Pulmonary effort is normal. No respiratory distress.     Breath sounds: Normal breath sounds. No wheezing.  Chest:     Breasts:        Right: No mass, skin change or tenderness.        Left: No mass, skin change or tenderness.     Comments: Implants normal, symmetrical Abdominal:     General: Bowel sounds are normal. There is no distension.     Palpations: Abdomen is soft.     Tenderness: There is no abdominal tenderness. There is no rebound.  Musculoskeletal:        General: Normal range of motion.     Cervical back: Normal range of motion and neck supple.  Neurological:     Mental Status: She is alert and oriented to person, place, and time.     Cranial Nerves: No cranial nerve deficit.  Skin:    General: Skin is warm and dry.  Psychiatric:        Judgment: Judgment normal.  Vitals reviewed.     Assessment:  ANNUAL EXAM 1. Women's annual routine gynecological examination   2. Oral contraceptive causing adverse effect in therapeutic use, subsequent encounter   3. Screening for cervical cancer      Screening Plan:            1.  Cervical Screening-  Pap smear done today  2.  Labs managed by PCP  3.  Counseling for contraception: oral contraceptives (estrogen/progesterone) - LARISSIA 0.1-20 MG-MCG tablet; Take 1 tablet by mouth daily.  Dispense: 84 tablet; Refill: 3   4. Fertility options discussed.  Prior labs c/w mild PCOS, no other sx's; reg periods.    F/U  Return in about 1 year (around 05/04/2021) for Annual.  [name], MD, [facility], [facility] 05/04/2020  2:04 PM

## 2020-05-11 LAB — CYTOLOGY - PAP
Chlamydia: NEGATIVE
Comment: NEGATIVE
Comment: NEGATIVE
Comment: NEGATIVE
Comment: NORMAL
Diagnosis: UNDETERMINED — AB
High risk HPV: NEGATIVE
Neisseria Gonorrhea: NEGATIVE
Trichomonas: NEGATIVE

## 2021-02-13 ENCOUNTER — Ambulatory Visit (LOCAL_COMMUNITY_HEALTH_CENTER): Payer: Self-pay | Admitting: Physician Assistant

## 2021-02-13 ENCOUNTER — Other Ambulatory Visit: Payer: Self-pay

## 2021-02-13 VITALS — BP 101/61 | Ht 69.25 in | Wt 158.2 lb

## 2021-02-13 DIAGNOSIS — Z3009 Encounter for other general counseling and advice on contraception: Secondary | ICD-10-CM

## 2021-02-13 DIAGNOSIS — Z124 Encounter for screening for malignant neoplasm of cervix: Secondary | ICD-10-CM

## 2021-02-13 DIAGNOSIS — Z01419 Encounter for gynecological examination (general) (routine) without abnormal findings: Secondary | ICD-10-CM

## 2021-02-13 DIAGNOSIS — Z113 Encounter for screening for infections with a predominantly sexual mode of transmission: Secondary | ICD-10-CM

## 2021-02-13 DIAGNOSIS — Z3161 Procreative counseling and advice using natural family planning: Secondary | ICD-10-CM

## 2021-02-13 LAB — WET PREP FOR TRICH, YEAST, CLUE
Trichomonas Exam: NEGATIVE
Yeast Exam: NEGATIVE

## 2021-02-13 NOTE — Progress Notes (Signed)
[facility] [address] Main Number: [phone]    Family Planning Visit- Initial Visit  Subjective:  Sara Conway is a 30 y.o.  G0P0000   being seen today for an initial annual visit and to discuss contraceptive options.  The patient is currently using None for pregnancy prevention. Patient reports she  would be OK if a pregnancy were to occur   in the next year.  Patient has the following medical conditions has Hypothyroidism; Encounter for other contraceptive management; Genital HSV; and Anxiety on their problem list.  Chief Complaint  Patient presents with   Contraception    Patient reports that she is here for a physical and pap today.  Reports that she was on OCP until a couple of months ago since she was 30 yr old.  States that she is not sure if she can become pregnant and has been using an app and ovulation kits some to try for a pregnancy but she is not "stressing" about it.  States that she has chest pain/shortness of breath related to her anxiety sometimes and it is relieved with deep breathing exercises.  Reports "regular" headaches that have not changed and are relieved with OTC analgesic and rest.  Per chart review, had CBE and pap in 2021 with Gyn.  Per note in chart, needs pap this year but [facility] Gaps have that pap is due in 2024.  Per chart review, last pap was ASCUS, HPV negative so will repeat pap today.  Patient denies any other concerns.   Body mass index is 23.19 kg/m. - Patient is eligible for diabetes screening based on BMI and age >66?  not applicable HA1C ordered? not applicable  Patient reports 3  partner/s in last year. Desires STI screening?  Yes  Has patient been screened once for HCV in the past?  No  No results found for: HCVAB  Does the patient have current drug use (including MJ), have a partner with drug use, and/or has been incarcerated since last result? No  If yes-- Screen for HCV  through [facility]   Does the patient meet criteria for HBV testing? No  Criteria:  -Household, sexual or needle sharing contact with HBV -History of drug use -HIV positive -Those with known Hep C   Health Maintenance Due  Topic Date Due   TETANUS/TDAP  Never done    Review of Systems  All other systems reviewed and are negative.  The following portions of the patient's history were reviewed and updated as appropriate: allergies, current medications, past family history, past medical history, past social history, past surgical history and problem list. Problem list updated.   See flowsheet for other program required questions.  Objective:   Vitals:   02/13/21 1000  BP: 101/61  Weight: 158 lb 3.2 oz (71.8 kg)  Height: 5' 9.25" (1.759 m)    Physical Exam Vitals and nursing note reviewed.  Constitutional:      General: She is not in acute distress.    Appearance: Normal appearance.  HENT:     Head: Normocephalic and atraumatic.     Mouth/Throat:     Mouth: Mucous membranes are moist.     Pharynx: Oropharynx is clear. No oropharyngeal exudate or posterior oropharyngeal erythema.  Eyes:     Conjunctiva/sclera: Conjunctivae normal.  Neck:     Thyroid: No thyroid mass, thyromegaly or thyroid tenderness.  Cardiovascular:     Rate and Rhythm: Normal rate  and regular rhythm.  Pulmonary:     Effort: Pulmonary effort is normal.  Abdominal:     Palpations: Abdomen is soft. There is no mass.     Tenderness: no abdominal tenderness There is no guarding or rebound.  Genitourinary:    General: Normal vulva.     Rectum: Normal.     Comments: External genitalia/pubic area without nits, lice, edema, erythema, lesions and inguinal adenopathy. Vagina with normal mucosa and discharge. Cervix without visible lesions. Uterus firm, mobile, nt, no masses, no CMT, no adnexal tenderness or fullness.  Musculoskeletal:     Cervical back: Neck supple. No tenderness.  Lymphadenopathy:      Cervical: No cervical adenopathy.  Skin:    General: Skin is warm and dry.     Findings: No bruising, erythema, lesion or rash.  Neurological:     Mental Status: She is alert and oriented to person, place, and time.  Psychiatric:        Mood and Affect: Mood normal.        Behavior: Behavior normal.        Thought Content: Thought content normal.        Judgment: Judgment normal.      Assessment and Plan:  Sara Conway is a 30 y.o. female presenting to the [facility] for an initial annual wellness/contraceptive visit  Contraception counseling: Reviewed all forms of birth control options in the tiered based approach. available including abstinence; over the counter/barrier methods; hormonal contraceptive medication including pill, patch, ring, injection,contraceptive implant, ECP; hormonal and nonhormonal IUDs; permanent sterilization options including vasectomy and the various tubal sterilization modalities. Risks, benefits, and typical effectiveness rates were reviewed.  Questions were answered.  Written information was also given to the patient to review.  Patient desires to not use any hormonal BCM at this time. She will follow up in  1 year and prn for surveillance.  She was told to call with any further questions, or with any concerns about this method of contraception.  Emphasized use of condoms 100% of the time for STI prevention.  Patient was not a candidate for ECP today.  1. Encounter for counseling regarding contraception Reviewed with patient as above re: BCMs- risks, benefits, and SE. Enc condoms with all sex for STD and pregnancy prevention if does not desire pregnancy.  2. Screening for STD (sexually transmitted disease) Await test results.  Counseled that RN will call if needs to RTC for treatment once results are back.  - WET PREP FOR TRICH, YEAST, CLUE - Chlamydia/Gonorrhea [place] [facility]  3. Routine Papanicolaou smear Counseled that RN will  call or send letter once results are back with follow up plan.  - IGP, Aptima HPV  4. Well woman exam with routine gynecological exam Reviewed with patient healthy habits to maintain general health. Counseled to avoid EtOH, tobacco, excessive caffeine, other drugs and teratogens. Enc MVI 1 po daily. Enc to establish with/ follow up with PCP for primary care concerns, age appropriate screenings and illness.   5. Counseling about natural family planning Natural Family Planning info given and reviewed with patient to help to achieve or prevent pregnancy.     No follow-ups on file.  No future appointments.  Matt Holmes, PA

## 2021-02-13 NOTE — Progress Notes (Addendum)
30 year old female in clinic today for a physical.   Declines birth control and HIV/Syphilis testing today.  Glenna Fellows, RN    [employer] reviewed.  No treatment needed. Glenna Fellows, RN

## 2021-02-16 LAB — IGP, APTIMA HPV
HPV Aptima: NEGATIVE
PAP Smear Comment: 0

## 2022-04-03 ENCOUNTER — Other Ambulatory Visit (HOSPITAL_COMMUNITY)
Admission: RE | Admit: 2022-04-03 | Discharge: 2022-04-03 | Disposition: A | Discharge status: Home / Self Care | Payer: Self-pay | Source: Ambulatory Visit | Attending: Obstetrics & Gynecology | Admitting: Obstetrics & Gynecology

## 2022-04-03 ENCOUNTER — Ambulatory Visit (INDEPENDENT_AMBULATORY_CARE_PROVIDER_SITE_OTHER): Payer: Self-pay | Admitting: Obstetrics & Gynecology

## 2022-04-03 VITALS — Ht 69.0 in | Wt 149.6 lb

## 2022-04-03 DIAGNOSIS — R102 Pelvic and perineal pain: Secondary | ICD-10-CM

## 2022-04-03 DIAGNOSIS — Z124 Encounter for screening for malignant neoplasm of cervix: Secondary | ICD-10-CM

## 2022-04-09 LAB — CYTOLOGY - PAP
Adequacy: ABSENT
Diagnosis: NEGATIVE
Diagnosis: REACTIVE

## 2022-04-23 ENCOUNTER — Encounter: Payer: Self-pay | Admitting: Obstetrics & Gynecology

## 2022-04-23 NOTE — Progress Notes (Signed)
Subjective:     Sara Conway is a 31 y.o. G0P0000 female here for a routine exam.  Current complaints: pt reports trying to get pregnant for past 4 yrs w/o success.  Personal health questionnaire reviewed: yes.   Gynecologic History Patient's last menstrual period was 03/25/2022. Contraception: none Last Pap: 05/04/2020. Results were: normal   Obstetric History OB History  Gravida Para Term Preterm AB Living  0 0 0 0 0 0  SAB IAB Ectopic Multiple Live Births  0 0 0 0 0     The following portions of the patient's history were reviewed and updated as appropriate: allergies, current medications, past family history, past medical history, past social history, past surgical history, and problem list.  Review of Systems A comprehensive review of systems was negative.    Objective:    Ht 5\' 9"  (1.753 m)   Wt 149 lb 9.6 oz (67.9 kg)   LMP 03/25/2022   BMI 22.09 kg/m  General appearance: alert, cooperative, and no distress Abdomen: normal findings: no organomegaly and soft, non-tender Pelvic: cervix normal in appearance, external genitalia normal, no adnexal masses or tenderness, no cervical motion tenderness, uterus normal size, shape, and consistency, and vagina normal without discharge Extremities: extremities normal, atraumatic, no cyanosis or edema Skin: Skin color, texture, turgor normal. No rashes or lesions    Assessment:    Healthy female exam.  Irregular menses, desired pregnancy   Plan:  HSG tubal patency PAP done  RTO as scheduled   05/25/2022, MD  04/23/2022 9:51 PM
